# Patient Record
Sex: Male | Born: 1995 | Race: Black or African American | Hispanic: No | Marital: Single | Smoking: Current every day smoker
Health system: Southern US, Community
[De-identification: ages and names within clinical notes are randomized; demographics above are authoritative.]

## PROBLEM LIST (undated history)

## (undated) DIAGNOSIS — Z789 Other specified health status: Secondary | ICD-10-CM

## (undated) HISTORY — DX: Other specified health status: Z78.9

## (undated) HISTORY — PX: FEMUR FRACTURE SURGERY: SHX633

---
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
## 2019-09-01 ENCOUNTER — Emergency Department (HOSPITAL_BASED_OUTPATIENT_CLINIC_OR_DEPARTMENT_OTHER)
Admission: EM | Admit: 2019-09-01 | Discharge: 2019-09-01 | Disposition: A | Discharge status: Home / Self Care | Payer: Managed Care, Other (non HMO) | Attending: Emergency Medicine | Admitting: Emergency Medicine

## 2019-09-01 ENCOUNTER — Other Ambulatory Visit: Payer: Self-pay

## 2019-09-01 ENCOUNTER — Encounter (HOSPITAL_BASED_OUTPATIENT_CLINIC_OR_DEPARTMENT_OTHER): Payer: Self-pay | Admitting: Emergency Medicine

## 2019-09-01 DIAGNOSIS — S46811A Strain of other muscles, fascia and tendons at shoulder and upper arm level, right arm, initial encounter: Secondary | ICD-10-CM | POA: Diagnosis not present

## 2019-09-01 DIAGNOSIS — Y999 Unspecified external cause status: Secondary | ICD-10-CM | POA: Diagnosis not present

## 2019-09-01 DIAGNOSIS — Y92410 Unspecified street and highway as the place of occurrence of the external cause: Secondary | ICD-10-CM | POA: Insufficient documentation

## 2019-09-01 DIAGNOSIS — S4991XA Unspecified injury of right shoulder and upper arm, initial encounter: Secondary | ICD-10-CM | POA: Diagnosis present

## 2019-09-01 DIAGNOSIS — Y9389 Activity, other specified: Secondary | ICD-10-CM | POA: Insufficient documentation
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
## 2019-09-01 NOTE — ED Notes (Signed)
ED Provider at bedside. 

## 2019-09-01 NOTE — Discharge Instructions (Signed)
Take 4 over the counter ibuprofen tablets 3 times a day or 2 over-the-counter naproxen tablets twice a day for pain. Also take tylenol 1000mg (2 extra strength) four times a day.   Return for shortness of breath, confusion, persistent vomiting.

## 2019-09-01 NOTE — ED Notes (Signed)
Pt verbalized understanding d/c instructions. Pt requested and was provided an ice pack

## 2019-09-04 ENCOUNTER — Encounter (HOSPITAL_BASED_OUTPATIENT_CLINIC_OR_DEPARTMENT_OTHER): Payer: Self-pay | Admitting: *Deleted

## 2019-09-04 ENCOUNTER — Other Ambulatory Visit: Payer: Self-pay

## 2019-09-04 ENCOUNTER — Emergency Department (HOSPITAL_BASED_OUTPATIENT_CLINIC_OR_DEPARTMENT_OTHER): Payer: Managed Care, Other (non HMO)

## 2019-09-04 ENCOUNTER — Emergency Department (HOSPITAL_BASED_OUTPATIENT_CLINIC_OR_DEPARTMENT_OTHER)
Admission: EM | Admit: 2019-09-04 | Discharge: 2019-09-05 | Disposition: A | Discharge status: Home / Self Care | Payer: Managed Care, Other (non HMO) | Attending: Emergency Medicine | Admitting: Emergency Medicine

## 2019-09-04 DIAGNOSIS — S5401XA Injury of ulnar nerve at forearm level, right arm, initial encounter: Secondary | ICD-10-CM | POA: Insufficient documentation

## 2019-09-04 DIAGNOSIS — Y999 Unspecified external cause status: Secondary | ICD-10-CM | POA: Diagnosis not present

## 2019-09-04 DIAGNOSIS — Y9389 Activity, other specified: Secondary | ICD-10-CM | POA: Insufficient documentation

## 2019-09-04 DIAGNOSIS — Y92410 Unspecified street and highway as the place of occurrence of the external cause: Secondary | ICD-10-CM | POA: Diagnosis not present

## 2019-09-04 DIAGNOSIS — S6991XA Unspecified injury of right wrist, hand and finger(s), initial encounter: Secondary | ICD-10-CM | POA: Diagnosis present
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
## 2022-06-13 ENCOUNTER — Encounter: Payer: Self-pay | Admitting: Family Medicine

## 2022-06-13 ENCOUNTER — Ambulatory Visit: Payer: Managed Care, Other (non HMO) | Admitting: Family Medicine

## 2022-06-13 VITALS — BP 108/76 | HR 53 | Temp 98.2°F | Ht 72.0 in | Wt 192.2 lb

## 2022-06-13 DIAGNOSIS — Z114 Encounter for screening for human immunodeficiency virus [HIV]: Secondary | ICD-10-CM

## 2022-06-13 DIAGNOSIS — Z0000 Encounter for general adult medical examination without abnormal findings: Secondary | ICD-10-CM

## 2022-06-13 DIAGNOSIS — Z1159 Encounter for screening for other viral diseases: Secondary | ICD-10-CM

## 2022-06-13 LAB — LIPID PANEL
Cholesterol: 193 mg/dL (ref 0–200)
HDL: 76 mg/dL (ref >39.00)
LDL Cholesterol: 109 mg/dL — ABNORMAL HIGH (ref 0–99)
NonHDL: 117.45
Total CHOL/HDL Ratio: 3
Triglycerides: 40 mg/dL (ref 0.0–149.0)
VLDL: 8 mg/dL (ref 0.0–40.0)

## 2022-06-13 LAB — COMPREHENSIVE METABOLIC PANEL
ALT: 19 U/L (ref 0–53)
AST: 18 U/L (ref 0–37)
Albumin: 4.7 g/dL (ref 3.5–5.2)
Alkaline Phosphatase: 55 U/L (ref 39–117)
BUN: 18 mg/dL (ref 6–23)
CO2: 30 mEq/L (ref 19–32)
Calcium: 9.6 mg/dL (ref 8.4–10.5)
Chloride: 101 mEq/L (ref 96–112)
Creatinine, Ser: 1.44 mg/dL (ref 0.40–1.50)
GFR: 67.54 mL/min (ref >60.00)
Glucose, Bld: 94 mg/dL (ref 70–99)
Potassium: 4.7 mEq/L (ref 3.5–5.1)
Sodium: 136 mEq/L (ref 135–145)
Total Bilirubin: 0.5 mg/dL (ref 0.2–1.2)
Total Protein: 7.5 g/dL (ref 6.0–8.3)

## 2022-06-13 LAB — CBC
HCT: 48.9 % (ref 39.0–52.0)
Hemoglobin: 16.4 g/dL (ref 13.0–17.0)
MCHC: 33.6 g/dL (ref 30.0–36.0)
MCV: 91.8 fl (ref 78.0–100.0)
Platelets: 166 10*3/uL (ref 150.0–400.0)
RBC: 5.32 Mil/uL (ref 4.22–5.81)
RDW: 13.4 % (ref 11.5–15.5)
WBC: 5 10*3/uL (ref 4.0–10.5)

## 2022-06-13 NOTE — Progress Notes (Signed)
Chief Complaint  Patient presents with   New Patient (Initial Visit)    Concerned about health/eats too much fast food.     Well Male Drew Fowler is here for a complete physical.   His last physical was >1 year ago.  Current diet: in general, a "healthy" diet.   Current exercise: none Weight trend: stable Fatigue out of ordinary? No. Seat belt? Yes.   Advanced directive? No  Health maintenance Tetanus- Yes- 10-18-16 HIV- No Hep C- No  Past Medical History:  Diagnosis Date   No known health problems      Past Surgical History:  Procedure Laterality Date   FEMUR FRACTURE SURGERY Right     Medications  Takes no meds routinely.   Allergies Allergies  Allergen Reactions   Morphine And Related Itching    Family History Family History  Problem Relation Age of Onset   High blood pressure Mother     Review of Systems: Constitutional: no fevers or chills Eye:  no recent significant change in vision Ear/Nose/Mouth/Throat:  Ears:  no hearing loss Nose/Mouth/Throat:  no complaints of nasal congestion, no sore throat Cardiovascular:  no chest pain Respiratory:  no shortness of breath Gastrointestinal:  no abdominal pain, no change in bowel habits GU:  Male: negative for dysuria Musculoskeletal/Extremities:  no pain of the joints Integumentary (Skin/Breast):  no abnormal skin lesions reported Neurologic:  no headaches Endocrine: No unexpected weight changes Hematologic/Lymphatic:  no night sweats  Exam BP 108/76 (BP Location: Left Arm, Patient Position: Sitting, Cuff Size: Normal)   Pulse (!) 53   Temp 98.2 F (36.8 C) (Oral)   Ht 6' (1.829 m)   Wt 192 lb 4 oz (87.2 kg)   SpO2 93%   BMI 26.07 kg/m  General:  well developed, well nourished, in no apparent distress Skin:  no significant moles, warts, or growths Head:  no masses, lesions, or tenderness Eyes:  pupils equal and round, sclera anicteric without injection Ears:  canals without lesions, TMs shiny  without retraction, no obvious effusion, no erythema Nose:  nares patent, mucosa normal Throat/Pharynx:  lips and gingiva without lesion; tongue and uvula midline; non-inflamed pharynx; no exudates or postnasal drainage Neck: neck supple without adenopathy, thyromegaly, or masses Lungs:  clear to auscultation, breath sounds equal bilaterally, no respiratory distress Cardio:  regular rate and rhythm, no bruits, no LE edema Abdomen:  abdomen soft, nontender; bowel sounds normal; no masses or organomegaly Genital (male): Deferred Rectal: Deferred Musculoskeletal:  symmetrical muscle groups noted without atrophy or deformity Extremities:  no clubbing, cyanosis, or edema, no deformities, no skin discoloration Neuro:  gait normal; deep tendon reflexes normal and symmetric Psych: well oriented with normal range of affect and appropriate judgment/insight  Assessment and Plan  Well adult exam - Plan: CBC, Comprehensive metabolic panel, Lipid panel  Encounter for hepatitis C screening test for low risk patient - Plan: Hepatitis C antibody  Screening for HIV without presence of risk factors - Plan: HIV Antibody (routine testing w rflx)   Well 26 y.o. male. Counseled on diet and exercise. Needs to do better with this.  Self testicular exams recommended at least monthly.  Advanced directive form provided today.  Other orders as above. Follow up in 1 year pending the above workup. The patient voiced understanding and agreement to the plan.  Jilda Roche [place], DO 06/13/22 11:58 AM

## 2022-06-13 NOTE — Patient Instructions (Addendum)
Give [place] 2-3 business days to get the results of your labs back.   Keep the diet clean and stay active.  Aim to do some physical exertion for 150 minutes per week. This is typically divided into 5 days per week, 30 minutes per day. The activity should be enough to get your heart rate up. Anything is better than nothing if you have time constraints.  Do monthly self testicular checks in the shower. You are feeling for lumps/bumps that don't belong. If you feel anything like this, let me know!  Please get me a copy of your advanced directive form at your convenience.   Let us know if you need anything.  Healthy Eating Plan Many factors influence your heart health, including eating and exercise habits. Heart (coronary) risk increases with abnormal blood fat (lipid) levels. Heart-healthy meal planning includes limiting unhealthy fats, increasing healthy fats, and making other small dietary changes. This includes maintaining a healthy body weight to help keep lipid levels within a normal range.  WHAT IS MY PLAN?  Your health care provider recommends that you: Drink a glass of water before meals to help with satiety. Eat slowly. An alternative to the water is to add Metamucil. This will help with satiety as well. It does contain calories, unlike water.  WHAT TYPES OF FAT SHOULD I CHOOSE? Choose healthy fats more often. Choose monounsaturated and polyunsaturated fats, such as olive oil and canola oil, flaxseeds, walnuts, almonds, and seeds. Eat more omega-3 fats. Good choices include salmon, mackerel, sardines, tuna, flaxseed oil, and ground flaxseeds. Aim to eat fish at least two times each week. Avoid foods with partially hydrogenated oils in them. These contain trans fats. Examples of foods that contain trans fats are stick margarine, some tub margarines, cookies, crackers, and other baked goods. If you are going to avoid a fat, this is the one to avoid!  WHAT GENERAL GUIDELINES DO I NEED TO  FOLLOW? Check food labels carefully to identify foods with trans fats. Avoid these types of options when possible. Fill one half of your plate with vegetables and green salads. Eat 4-5 servings of vegetables per day. A serving of vegetables equals 1 cup of raw leafy vegetables,  cup of raw or cooked cut-up vegetables, or  cup of vegetable juice. Fill one fourth of your plate with whole grains. Look for the word "whole" as the first word in the ingredient list. Fill one fourth of your plate with lean protein foods. Eat 4-5 servings of fruit per day. A serving of fruit equals one medium whole fruit,  cup of dried fruit,  cup of fresh, frozen, or canned fruit. Try to avoid fruits in cups/syrups as the sugar content can be high. Eat more foods that contain soluble fiber. Examples of foods that contain this type of fiber are apples, broccoli, carrots, beans, peas, and barley. Aim to get 20-30 g of fiber per day. Eat more home-cooked food and less restaurant, buffet, and fast food. Limit or avoid alcohol. Limit foods that are high in starch and sugar. Avoid fried foods when able. Cook foods by using methods other than frying. Baking, boiling, grilling, and broiling are all great options. Other fat-reducing suggestions include: Removing the skin from poultry. Removing all visible fats from meats. Skimming the fat off of stews, soups, and gravies before serving them. Steaming vegetables in water or broth. Lose weight if you are overweight. Losing just 5-10% of your initial body weight can help your overall health and prevent  diseases such as diabetes and heart disease. Increase your consumption of nuts, legumes, and seeds to 4-5 servings per week. One serving of dried beans or legumes equals  cup after being cooked, one serving of nuts equals 1 ounces, and one serving of seeds equals  ounce or 1 tablespoon.  WHAT ARE GOOD FOODS CAN I EAT? Grains Grainy breads (try to find bread that is 3 g of  fiber per slice or greater), oatmeal, light popcorn. Whole-grain cereals. Rice and pasta, including brown rice and those that are made with whole wheat. Edamame pasta is a great alternative to grain pasta. It has a higher protein content. Try to avoid significant consumption of white bread, sugary cereals, or pastries/baked goods.  Vegetables All vegetables. Cooked white potatoes do not count as vegetables.  Fruits All fruits, but limit pineapple and bananas as these fruits have a higher sugar content.  Meats and Other Protein Sources Lean, well-trimmed beef, veal, pork, and lamb. Chicken and [place] without skin. All fish and shellfish. Wild duck, rabbit, pheasant, and venison. Egg whites or low-cholesterol egg substitutes. Dried beans, peas, lentils, and tofu. Seeds and most nuts.  Dairy Low-fat or nonfat cheeses, including ricotta, string, and mozzarella. Skim or 1% milk that is liquid, powdered, or evaporated. Buttermilk that is made with low-fat milk. Nonfat or low-fat yogurt. Soy/Almond milk are good alternatives if you cannot handle dairy.  Beverages Water is the best for you. Sports drinks with less sugar are more desirable unless you are a highly active athlete.  Sweets and Desserts Sherbets and fruit ices. Honey, jam, marmalade, jelly, and syrups. Dark chocolate.  Eat all sweets and desserts in moderation.  Fats and Oils Nonhydrogenated (trans-free) margarines. Vegetable oils, including soybean, sesame, sunflower, olive, peanut, safflower, corn, canola, and cottonseed. Salad dressings or mayonnaise that are made with a vegetable oil. Limit added fats and oils that you use for cooking, baking, salads, and as spreads.  Other Cocoa powder. Coffee and tea. Most condiments.  The items listed above may not be a complete list of recommended foods or beverages. Contact your dietitian for more options.  Wrist and Forearm Exercises Do exercises exactly as told by your health care  provider and adjust them as directed. It is normal to feel mild stretching, pulling, tightness, or discomfort as you do these exercises, but you should stop right away if you feel sudden pain or your pain gets worse.   RANGE OF MOTION EXERCISES These exercises warm up your muscles and joints and improve the movement and flexibility of your injured wrist and forearm. These exercises also help to relieve pain, numbness, and tingling. These exercises are done using the muscles in your injured wrist and forearm. Exercise A: Wrist Flexion, Active With your fingers relaxed, bend your wrist forward as far as you can. Hold this position for 30 seconds. Repeat 2 times. Complete this exercise 3 times per week. Exercise B: Wrist Extension, Active With your fingers relaxed, bend your wrist backward as far as you can. Hold this position for 30 seconds. Repeat 2 times. Complete this exercise 3 times per week. Exercise C: Supination, Active  Stand or sit with your arms at your sides. Bend your left / right elbow to an "L" shape (90 degrees). Turn your palm upward until you feel a gentle stretch on the inside of your forearm. Hold this position for 30 seconds. Slowly return your palm to the starting position. Repeat 2 times. Complete this exercise 3 times per week. Exercise  D: Pronation, Active  Stand or sit with your arms at your sides. Bend your left / right elbow to an "L" shape (90 degrees). Turn your palm downward until you feel a gentle stretch on the top of your forearm. Hold this position for 30 seconds. Slowly return your palm to the starting position. Repeat 2 times. Complete this exercise once a day.  STRETCHING EXERCISES These exercises warm up your muscles and joints and improve the movement and flexibility of your injured wrist and forearm. These exercises also help to relieve pain, numbness, and tingling. These exercises are done using your healthy wrist and forearm to help stretch the  muscles in your injured wrist and forearm. Exercise E: Wrist Flexion, Passive  Extend your left / right arm in front of you, relax your wrist, and point your fingers downward. Gently push on the back of your hand. Stop when you feel a gentle stretch on the top of your forearm. Hold this position for 30 seconds. Repeat 2 times. Complete this exercise 3 times per week. Exercise F: Wrist Extension, Passive  Extend your left / right arm in front of you and turn your palm upward. Gently pull your palm and fingertips back so your fingers point downward. You should feel a gentle stretch on the palm-side of your forearm. Hold this position for 30 seconds. Repeat 2 times. Complete this exercise 3 times per week. Exercise G: Forearm Rotation, Supination, Passive Sit with your left / right elbow bent to an "L" shape (90 degrees) with your forearm resting on a table. Keeping your upper body and shoulder still, use your other hand to rotate your forearm palm-up until you feel a gentle to moderate stretch. Hold this position for 30 seconds. Slowly release the stretch and return to the starting position. Repeat 2 times. Complete this exercise 3 times per week. Exercise H: Forearm Rotation, Pronation, Passive Sit with your left / right elbow bent to an "L" shape (90 degrees) with your forearm resting on a table. Keeping your upper body and shoulder still, use your other hand to rotate your forearm palm-down until you feel a gentle to moderate stretch. Hold this position for 30 seconds. Slowly release the stretch and return to the starting position. Repeat 2 times. Complete this exercise 3 times per week.  STRENGTHENING EXERCISES These exercises build strength and endurance in your wrist and forearm. Endurance is the ability to use your muscles for a long time, even after they get tired. Exercise I: Wrist Flexors  Sit with your left / right forearm supported on a table and your hand resting palm-up over  the edge of the table. Your elbow should be bent to an "L" shape (about 90 degrees) and be below the level of your shoulder. Hold a 3-5 lb weight in your left / right hand. Or, hold a rubber exercise band or tube in both hands, keeping your hands at the same level and hip distance apart. There should be a slight tension in the exercise band or tube. Slowly curl your hand up toward your forearm. Hold this position for 3 seconds. Slowly lower your hand back to the starting position. Repeat 2 times. Complete this exercise 3 times per week. Exercise J: Wrist Extensors  Sit with your left / right forearm supported on a table and your hand resting palm-down over the edge of the table. Your elbow should be bent to an "L" shape (about 90 degrees) and be below the level of your shoulder. Hold  a 3-5 lb weight in your left / right hand. Or, hold a rubber exercise band or tube in both hands, keeping your hands at the same level and hip distance apart. There should be a slight tension in the exercise band or tube. Slowly curl your hand up toward your forearm. Hold this position for 3 seconds. Slowly lower your hand back to the starting position. Repeat 2 times. Complete this exercise 3 times per week. Exercise K: Forearm Rotation, Supination  Sit with your left / right forearm supported on a table and your hand resting palm-down. Your elbow should be at your side, bent to an "L" shape (about 90 degrees), and below the level of your shoulder. Keep your wrist stable and in a neutral position throughout the exercise. Gently hold a lightweight hammer with your left / right hand. Without moving your elbow or wrist, slowly rotate your palm upward to a thumbs-up position. Hold this position for 3 seconds. Slowly return your forearm to the starting position. Repeat 2 times. Complete this exercise 3 times per week. Exercise L: Forearm Rotation, Pronation  Sit with your left / right forearm supported on a table and  your hand resting palm-up. Your elbow should be at your side, bent to an "L" shape (about 90 degrees), and below the level of your shoulder. Keep your wrist stable. Do not allow it to move backward or forward during the exercise. Gently hold a lightweight hammer with your left / right hand. Without moving your elbow or wrist, slowly rotate your palm and hand upward to a thumbs-up position. Hold this position for 3 seconds. Slowly return your forearm to the starting position. Repeat 2 times. Complete this exercise 3 times per week. Exercise M: Grip Strengthening  Hold one of these items in your left / right hand: play dough, therapy putty, a dense sponge, a stress ball, or a large, rolled sock. Squeeze as hard as you can without increasing pain. Hold this position for 5 seconds. Slowly release your grip. Repeat 2 times. Complete this exercise 3 times per week.  This information is not intended to replace advice given to you by your health care provider. Make sure you discuss any questions you have with your health care provider. Document Released: 05/18/2005 Document Revised: 03/28/2016 Document Reviewed: 03/29/2015 [employer]vier Interactive Patient Education  [employer].

## 2022-06-14 LAB — HEPATITIS C ANTIBODY: Hepatitis C Ab: NONREACTIVE

## 2022-06-14 LAB — HIV ANTIBODY (ROUTINE TESTING W REFLEX): HIV 1&2 Ab, 4th Generation: NONREACTIVE
